# Patient Record
Sex: Female | Born: 1990 | Race: White | Hispanic: No | Marital: Single | Smoking: Never smoker
Health system: Southern US, Community
[De-identification: ages and names within clinical notes are randomized; demographics above are authoritative.]

---
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
## 2012-02-09 ENCOUNTER — Ambulatory Visit (INDEPENDENT_AMBULATORY_CARE_PROVIDER_SITE_OTHER): Payer: 59 | Admitting: Family Medicine

## 2012-02-09 VITALS — BP 92/64 | Ht 65.0 in | Wt 135.0 lb

## 2012-02-09 DIAGNOSIS — M25561 Pain in right knee: Secondary | ICD-10-CM

## 2012-02-09 DIAGNOSIS — M25569 Pain in unspecified knee: Secondary | ICD-10-CM

## 2012-02-09 MED ORDER — NITROGLYCERIN 0.2 MG/HR TD PT24
MEDICATED_PATCH | TRANSDERMAL | Status: DC
Start: 2012-02-09 — End: 2012-11-29

## 2012-02-09 MED ORDER — MELOXICAM 15 MG PO TABS: 15.0000 mg | ORAL_TABLET | Freq: Every day | ORAL | Status: DC

## 2012-02-09 NOTE — Patient Instructions (Addendum)
Very nice to meet you. I think you have a bursitis that is giving you your pain.  I am givign you a sleeve to try because I think it will keep the muscle warmer.  I am sending in a medicine called meloxicam.  Take 1 pill daily for 2 weeks then as needed therafter.   I am also giving you a nitroglycerin patch to try 1/4 patch to area daily.  I t will give you a little headache but should resolve in 4-5 days.  Do the stretches after exercise Come back in 4 weeks.  If not better then we will do a MRI.

## 2012-02-10 NOTE — Assessment & Plan Note (Signed)
Differential still broad.  ? Superior lateral displacement of the pes anserine which could be contributing to the pain, mild pain over the medial joint line but negative mcmurry test and ultrasound for mensical injury but could still be.  Patient given stretches and exercises to try to focus on hamstrings, given meloxicam to decrease inflammation and given compression sleeve to give some mild support and help with any swelling.  Will try for four weeks, if not better will consider injection and potential MRI.

## 2012-02-11 NOTE — Progress Notes (Signed)
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  Patient states she has pain mostly with certain movements such as running and going up stairs. Patient states it started to hurt last season when she went to kick a ball and then was blocked which caused pain and swelling.  Patient though felt like she got better but since playing this time it seemed to be bothering her more at this time. Patient states it make her feel like it has a a deep tight feeling but denies any locking or catching.  It does not wake her up at night.  Patient states the only thing that makes it feel better is rest but she would like to continue playing soccer.  Patient has had an MRI which was normal and done PT without any improvement. Past medical history, social, surgical and family history all reviewed.  Denies fever, chills, nausea vomiting abdominal pain, dysuria, chest pain, shortness of breath dyspnea on exertion or numbness in extremities   R knee: No gross deformity, ecchymoses, swelling. Mild medial joint line TTP and inferior inferior to the medial joint line which feel like a medial displaced pes.   No lateral joint line, post patellar facet or other TTP. FROM. Negative ant/post drawers. Negative valgus/varus testing. Negative lachmanns. Mild medial pain with mcmurrays, apleys, and thessalys.  Negative sit home.  Negative patellar apprehension, clarkes. NV intact distally. No pain with sartorius testing or knee extension, 5/5 strength. Leg lengths equal.  L knee: FROM without pain or instability.   Ultrasound performed and interpreted by me today. Patient medial meniscus was seen and appear normal with no true tear identified but had mild hypoechoic changes.  But inferior to the joint lint patient does have fluid filled sac that appears to be a bursae and insertion of hamstrings that appear to have mild separation and neovascularization.

## 2012-02-12 ENCOUNTER — Other Ambulatory Visit: Payer: Self-pay | Admitting: *Deleted

## 2012-02-12 MED ORDER — NAPROXEN 500 MG PO TABS: 500.0000 mg | ORAL_TABLET | Freq: Two times a day (BID) | ORAL | Status: DC | PRN

## 2012-03-12 ENCOUNTER — Encounter: Payer: Self-pay | Admitting: Family Medicine

## 2012-03-12 ENCOUNTER — Ambulatory Visit (INDEPENDENT_AMBULATORY_CARE_PROVIDER_SITE_OTHER): Payer: 59 | Admitting: Family Medicine

## 2012-03-12 VITALS — BP 117/66 | HR 92

## 2012-03-12 DIAGNOSIS — M25561 Pain in right knee: Secondary | ICD-10-CM

## 2012-03-12 DIAGNOSIS — M25569 Pain in unspecified knee: Secondary | ICD-10-CM

## 2012-03-12 NOTE — Progress Notes (Signed)
Patient ID: Natalie Powell, female   DOB: 1991/03/27, 21 y.o.   MRN: [id number]  SUBJECTIVE: Natalie Powell is a 21 y.o. female with unremarkable PMHx presenting for follow-up of RIGHT knee pain and new RIGHT hip pain.  Pt with knee pain of unclear etiology at last visit 3 weeks ago, considered pes anserine vs hamstring strain vs other etiologies.  Provided with meloxicam to decrease inflammation, compression sleeve and stretches / strengthening for hamstrings.  Had difficulty tolerating meloxicam secondary to GI upset but switched to alleve and tolerated that well for a scheduled, 10-day course.  Pt reports resolution of knee pain, now pain-free with activity with respect to knee pain.  She runs daily and has not pain in knees, able to complete all ADL without pain as well.  Her primary concern for today is RIGHT hip pain which she reports is nagging, dull during running.  Pain-free with walking.  Pt denies any specific injury, trauma or specific moment of onset with regard to hip pain.  She has not tried any medications or other interventions for the pain.  Denies any instability, popping, catching or clicking with respect to her RIGHT hip.       OBJECTIVE: Vital signs as noted above;  Well appearing young female, resting comfortably and in NAD Bilateral knee exam, hip exam: - On inspection there are no apparent abnormalities or deficits - AROM of knee symmetric and appropriate bilaterally in flexion, extension - AROM of hip symmetric in hip flexion, extension, IR, ER, abduction and adduction. Though symmetric there is deficit in active and passive ER of hip, also in extension.  Deficits improve slightly but still present with PROM. - 5/5 strength in each of the above movements; all resisted movements are pain-free - 4+ or 5/5 strength in bilateral hip abduction  - 5/5 strength in supine hamstring flexion and 90 and 30 degrees of knee flexion, pain-free - FABERs bilaterally is significantly limited  with knee laying approx 6 cm above the un-flexed thigh.  Unable to push knee down to table (or even level of un-flexed thigh) secondary to poor flexibility - Negative ober's test bilaterally - Bilaterally, negative ant/post drawers, lachman's, valgus/varus on knees - Negative for tenderness along joint line and major bony landmarks of bilateral knees  ASSESSMENT: 1. RIGHT Hip pain secondary to tight iliotibial band and hamstrings (right > left)  PLAN: 21 y/o female with resolved RIGHT knee pain with exercises and stretches.  Now her primary issue is RIGHT hip pain, occasional (different) posterior knee pain as well.  Exam suggestive of RIGHT iliotibial band and hamstring inflexibility leading to significant restrictions in ER and extension causing pain, discomfort when running.  Pt given home exercises (after they were demonstrated) for IT band, hamstrings as well as hip abductor/adductor/IR/ER strengthening and stretching.  She will complete exercises daily, ideally in the morning, and use NSAIDs as needed for pain control.  She may continue running.  She will follow-up in 6 weeks to assess improved flexibility, strength and overall response to exercises/rehab.  Also encouraged core strengthening.

## 2012-11-21 ENCOUNTER — Other Ambulatory Visit: Payer: Self-pay | Admitting: Family Medicine

## 2012-11-21 DIAGNOSIS — R1013 Epigastric pain: Secondary | ICD-10-CM

## 2012-11-21 DIAGNOSIS — R142 Eructation: Secondary | ICD-10-CM

## 2012-11-27 ENCOUNTER — Ambulatory Visit
Admission: RE | Admit: 2012-11-27 | Discharge: 2012-11-27 | Disposition: A | Discharge status: Home / Self Care | Payer: 59 | Source: Ambulatory Visit | Attending: Family Medicine | Admitting: Family Medicine

## 2012-11-27 DIAGNOSIS — R142 Eructation: Secondary | ICD-10-CM

## 2012-11-27 DIAGNOSIS — R1013 Epigastric pain: Secondary | ICD-10-CM

## 2012-11-29 ENCOUNTER — Ambulatory Visit (INDEPENDENT_AMBULATORY_CARE_PROVIDER_SITE_OTHER): Payer: 59 | Admitting: Internal Medicine

## 2012-11-29 ENCOUNTER — Encounter: Payer: Self-pay | Admitting: Internal Medicine

## 2012-11-29 VITALS — BP 122/64 | HR 77 | Ht 65.0 in | Wt 138.4 lb

## 2012-11-29 DIAGNOSIS — R143 Flatulence: Secondary | ICD-10-CM

## 2012-11-29 DIAGNOSIS — J309 Allergic rhinitis, unspecified: Secondary | ICD-10-CM

## 2012-11-29 DIAGNOSIS — R142 Eructation: Secondary | ICD-10-CM

## 2012-11-29 NOTE — Progress Notes (Signed)
LOV 06/16/10- For Asthma/ EIA, allergic rinitis 11/29/12- 21 yoF never smoker FOLLOWS FOR: Pt c/o belching. Pt is having a hard time taking deep breath. She does notice chest pains when she belches. Denies any wheezing. no chest tx, no cough. does not feel like it is reflux, it is all "air".   Mother is here Says she can't breathe through her nose normally. Admits her nose is not stopped up. She does not have to blow it.  Complains of belching as described above, since July.  ROS-see HPI Constitutional:   No-   weight loss, night sweats, fevers, chills, fatigue, lassitude. HEENT:   No-  headaches, difficulty swallowing, tooth/dental problems, sore throat,       No-  sneezing, itching, ear ache, nasal congestion, post nasal drip,  CV:  No-   chest pain, orthopnea, PND, swelling in lower extremities, anasarca,  dizziness, palpitations Resp: No-   shortness of breath with exertion or at rest.              No-   productive cough,  No non-productive cough,  No- coughing up of blood.              No-   change in color of mucus.  No- wheezing.   Skin: No-   rash or lesions. GI:  No-   heartburn, indigestion, abdominal pain, nausea, vomiting,  GU: . MS:  No-   joint pain or swelling.  . Neuro-     nothing unusual Psych:  No- change in mood or affect. No depression or anxiety.  No memory loss.  OBJ- Physical Exam General- Alert, Oriented, Affect-appropriate, Distress- none acute Skin- rash-none, lesions- none, excoriation- none Lymphadenopathy- none Head- atraumatic            Eyes- Gross vision intact, PERRLA, conjunctivae and secretions clear            Ears- Hearing, canals-normal            Nose- Clear, narrow but not obstructed, no-Septal dev, mucus, polyps, erosion, perforation             Throat- Mallampati II , mucosa clear , drainage- none, tonsils- atrophic Neck- flexible , trachea midline, no stridor , thyroid nl, carotid no bruit Chest - symmetrical excursion , unlabored  Heart/CV- RRR , no murmur , no gallop  , no rub, nl s1 s2                           - JVD- none , edema- none, stasis changes- none, varices- none           Lung- clear to P&A, wheeze- none, cough- none , dullness-none, rub- none           Chest wall-  Abd-  Br/ Gen/ Rectal- Not done, not indicated Extrem- cyanosis- none, clubbing, none, atrophy- none, strength- nl Neuro- grossly intact to observation

## 2012-11-29 NOTE — Patient Instructions (Addendum)
Sample Nasonex or Omnaris     1-2 puffs each nostril once daily at bedtime  Use it up and see if there is any difference in the nose breathing  You can also try Breathe Right Nasal strips to see how that feels  For the belching, be aware of air-swallowing, avoid foods that make you feel gassey, and avoid baking soda/ sodium bicarbonate and carbonated drinks.  If you continue to feel breathing through your nose isn't easy, then I would suggest you see an ENT who has tools to look back farther.

## 2012-12-08 ENCOUNTER — Encounter: Payer: Self-pay | Admitting: Internal Medicine

## 2012-12-08 DIAGNOSIS — R142 Eructation: Secondary | ICD-10-CM | POA: Insufficient documentation

## 2012-12-08 NOTE — Assessment & Plan Note (Signed)
Plan-try Flonase and nasal strips

## 2012-12-08 NOTE — Assessment & Plan Note (Signed)
She is swallowing a lot of air. Nasal airway is not obviously blocked. We discussed this as a habit pattern, probably involving some anxiety. Plan-make appointment breathing through the nose. Nasal strips may make that more comfortable. Can also try Flonase if that changes what nose breathing feels like to her. Simethicone.

## 2013-01-20 ENCOUNTER — Other Ambulatory Visit: Payer: Self-pay | Admitting: Gastroenterology

## 2013-01-20 ENCOUNTER — Other Ambulatory Visit (HOSPITAL_COMMUNITY): Payer: Self-pay | Admitting: Gastroenterology

## 2013-01-20 DIAGNOSIS — R110 Nausea: Secondary | ICD-10-CM

## 2013-01-24 ENCOUNTER — Ambulatory Visit (HOSPITAL_COMMUNITY)
Admission: RE | Admit: 2013-01-24 | Discharge: 2013-01-24 | Disposition: A | Discharge status: Home / Self Care | Payer: 59 | Source: Ambulatory Visit | Attending: Gastroenterology | Admitting: Gastroenterology

## 2013-01-24 DIAGNOSIS — R6881 Early satiety: Secondary | ICD-10-CM | POA: Insufficient documentation

## 2013-01-24 DIAGNOSIS — R110 Nausea: Secondary | ICD-10-CM

## 2013-01-24 DIAGNOSIS — R141 Gas pain: Secondary | ICD-10-CM | POA: Insufficient documentation

## 2013-01-24 DIAGNOSIS — R142 Eructation: Secondary | ICD-10-CM | POA: Insufficient documentation

## 2013-02-07 ENCOUNTER — Ambulatory Visit (HOSPITAL_COMMUNITY)
Admission: RE | Admit: 2013-02-07 | Discharge: 2013-02-07 | Disposition: A | Discharge status: Home / Self Care | Payer: 59 | Source: Ambulatory Visit | Attending: Gastroenterology | Admitting: Gastroenterology

## 2013-02-07 DIAGNOSIS — R110 Nausea: Secondary | ICD-10-CM | POA: Insufficient documentation

## 2013-02-07 DIAGNOSIS — R142 Eructation: Secondary | ICD-10-CM | POA: Insufficient documentation

## 2013-02-07 DIAGNOSIS — R141 Gas pain: Secondary | ICD-10-CM | POA: Insufficient documentation

## 2013-02-07 MED ORDER — TECHNETIUM TC 99M MEBROFENIN IV KIT
5.2000 | PACK | Freq: Once | INTRAVENOUS | Status: AC | PRN
  Administered 2013-02-07: 5 via INTRAVENOUS

## 2013-02-17 ENCOUNTER — Other Ambulatory Visit (HOSPITAL_COMMUNITY): Payer: Self-pay | Admitting: Gastroenterology

## 2013-02-17 DIAGNOSIS — R110 Nausea: Secondary | ICD-10-CM

## 2013-02-25 ENCOUNTER — Encounter (HOSPITAL_COMMUNITY)
Admission: RE | Admit: 2013-02-25 | Discharge: 2013-02-25 | Disposition: A | Discharge status: Home / Self Care | Payer: 59 | Source: Ambulatory Visit | Attending: Gastroenterology | Admitting: Gastroenterology

## 2013-02-25 DIAGNOSIS — R110 Nausea: Secondary | ICD-10-CM

## 2013-02-25 MED ORDER — TECHNETIUM TC 99M SULFUR COLLOID
2.0000 | Freq: Once | INTRAVENOUS | Status: AC | PRN
  Administered 2013-02-25: 2 via ORAL

## 2013-07-31 ENCOUNTER — Other Ambulatory Visit: Payer: Self-pay | Admitting: Sports Medicine

## 2013-07-31 ENCOUNTER — Ambulatory Visit
Admission: RE | Admit: 2013-07-31 | Discharge: 2013-07-31 | Disposition: A | Discharge status: Home / Self Care | Payer: Managed Care, Other (non HMO) | Source: Ambulatory Visit | Attending: Sports Medicine | Admitting: Sports Medicine

## 2013-07-31 ENCOUNTER — Encounter: Payer: Self-pay | Admitting: Sports Medicine

## 2013-07-31 ENCOUNTER — Ambulatory Visit (INDEPENDENT_AMBULATORY_CARE_PROVIDER_SITE_OTHER): Payer: Managed Care, Other (non HMO) | Admitting: Sports Medicine

## 2013-07-31 VITALS — BP 110/74 | Ht 65.0 in | Wt 135.0 lb

## 2013-07-31 DIAGNOSIS — M545 Low back pain, unspecified: Secondary | ICD-10-CM

## 2013-08-01 NOTE — Progress Notes (Signed)
   Subjective:    Patient ID: Natalie Powell, female    DOB: 1990-08-03, 23 y.o.   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 No trauma that she can recall but gradual onset of pain that is present mainly with walking and running. She will get some occasional spasm in the morning as well. Pain is aching in quality and she localizes it around the right SI joint. Denies any left-sided low back pain. Denies any groin pain. Denies radiating pain into her legs. No numbness or tingling. She has tried some simple stretches but has not tried any pain medication. She has not had low back problems in the past. No fevers or chills. No weight loss. She finds that forward flexion or sitting in a slumped position helps her pain.  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 No acute distress. Awake alert and oriented x3. Vital signs reviewed.  Lumbar spine: Full lumbar range of motion. Pain with extension. Pain with stork testing bilaterally. No tenderness to palpation along the lumbar midline nor along the paraspinal musculature. No spasm. No tenderness over the SI joint. Negative Faber's. Negative log roll. Strength is 5/5 both lower extremities. Reflexes trace but equal at the Achilles and patellar tendons.  Patient has fairly well-preserved arches in her feet. Evaluation of her running form shows her to hold the right arm and elbow right up against the side of her body. She demonstrates no real follow through with the right arm. When I asked her if she was trying to split her side due to pain she denied any pain currently.  X-rays of her lumbar spine including AP, lateral, and oblique views are unremarkable. Specifically I see no evidence of pars defect or stress  fracture.       Assessment & Plan:  1. Right-sided low back pain likely mechanical in nature due to poor running form  I've instructed the patient in some simple line drills with the emphasis being on making sure that her right hand comes up to the midline during the swing phase of running. She will perform these drills twice a week and if her symptoms persist we discussed the possibility of formal physical therapy versus an MRI of her lumbar spine to rule out an occult lumbar stress fracture. Since she is in school at [employer], we will leave followup open ended but she is instructed to notify me if her symptoms do not resolve over the next 3-4 weeks.

## 2014-08-15 IMAGING — US US ABDOMEN COMPLETE
1 series · 14 of 25 positions shown · non-contrast
Comparison: None.

CLINICAL DATA: Epigastric pain, belching

COMPLETE ABDOMINAL ULTRASOUND

[Series 1: us abdomen complete · 0.33mm/px · 14 of 73 slices shown]
[im 1/73]
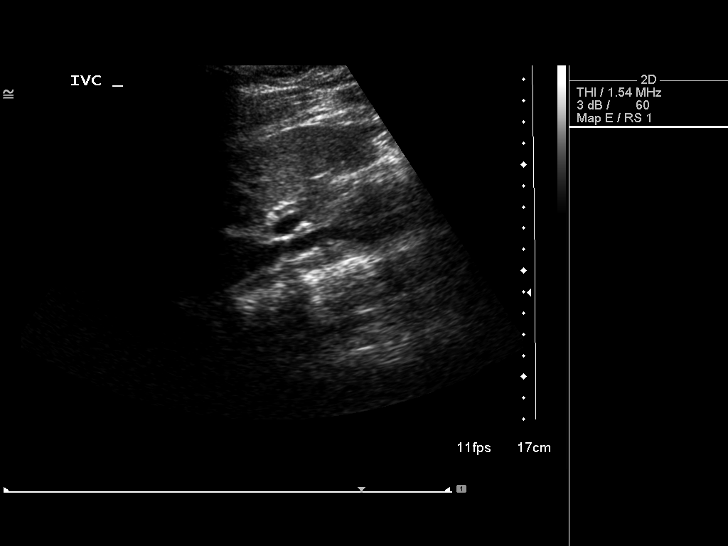
[im 7/73]
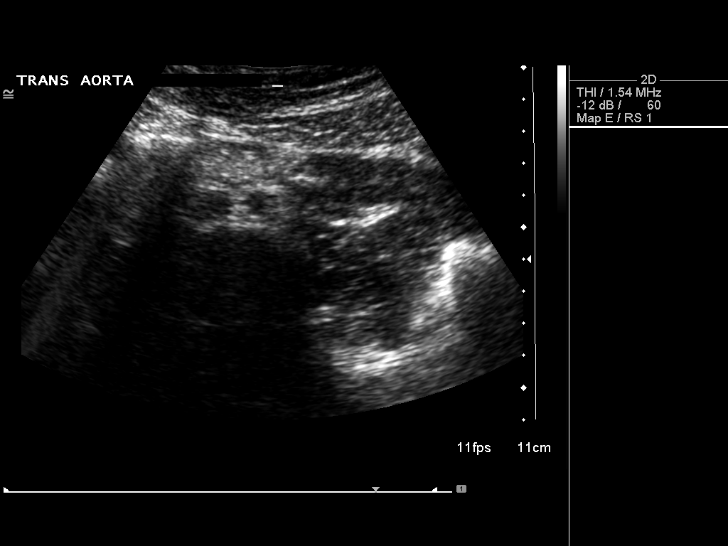
[im 13/73]
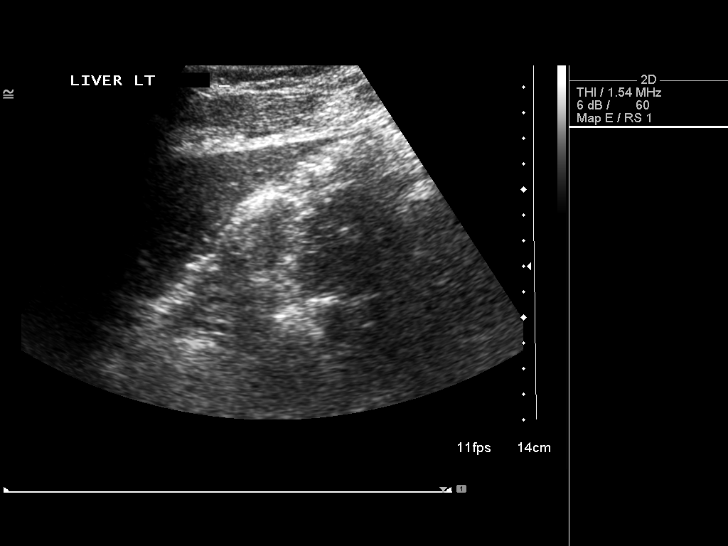
[im 19/73]
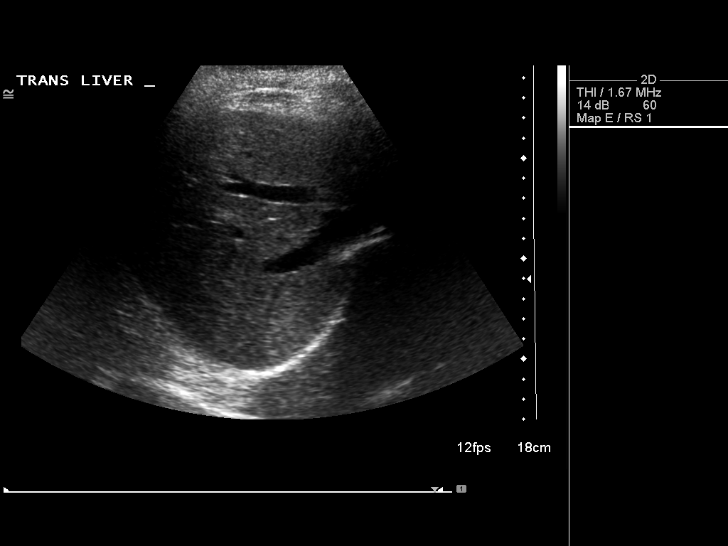
[im 25/73]
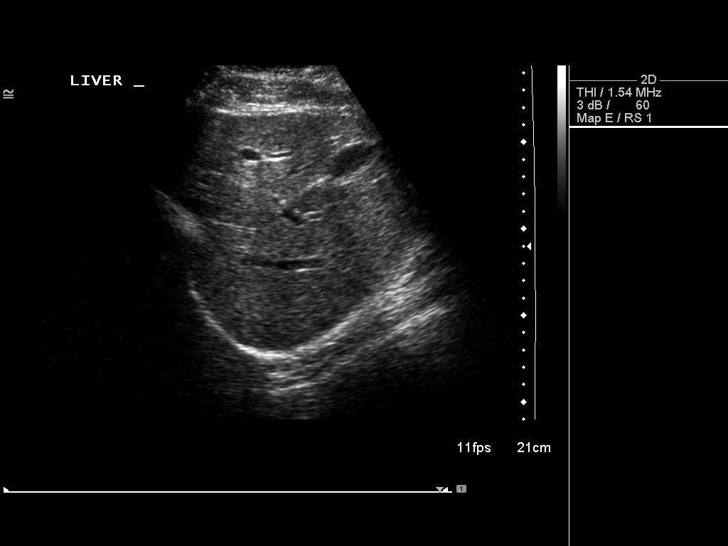
[im 28/73]
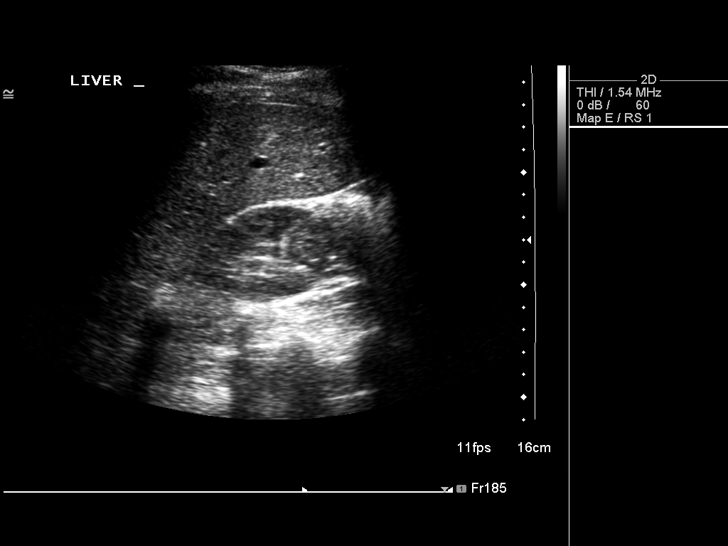
[im 34/73]
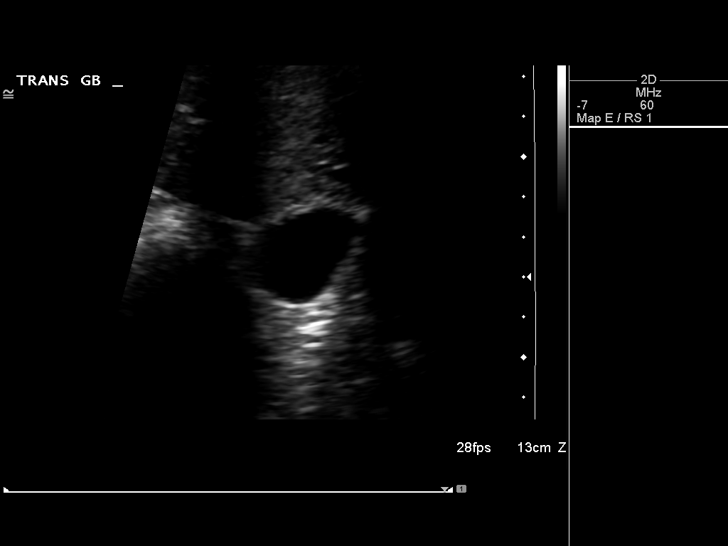
[im 40/73]
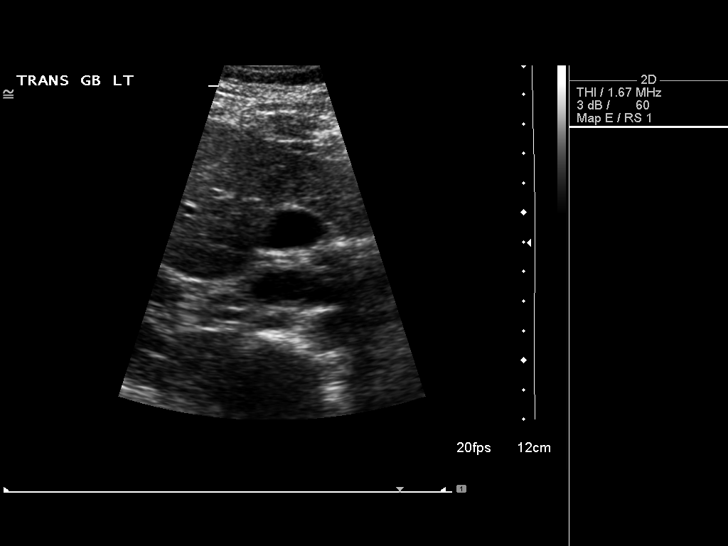
[im 46/73]
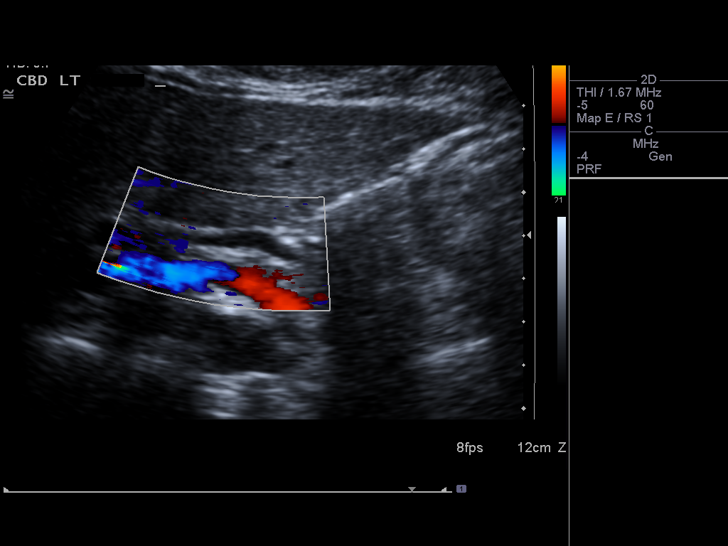
[im 49/73]
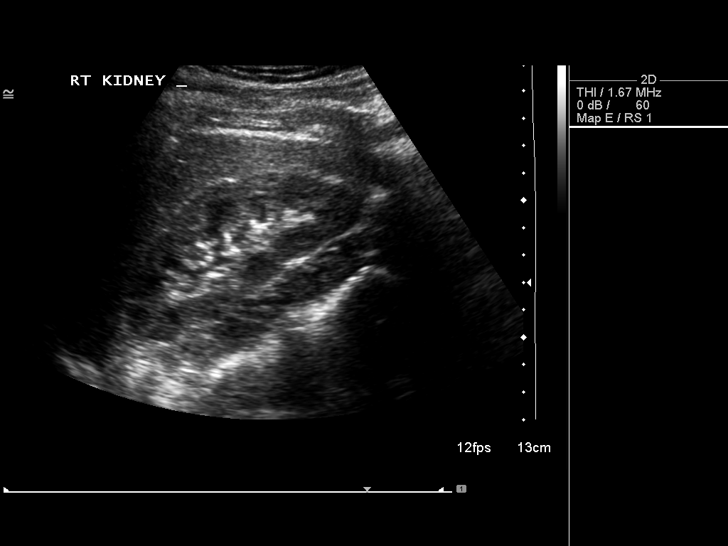
[im 55/73]
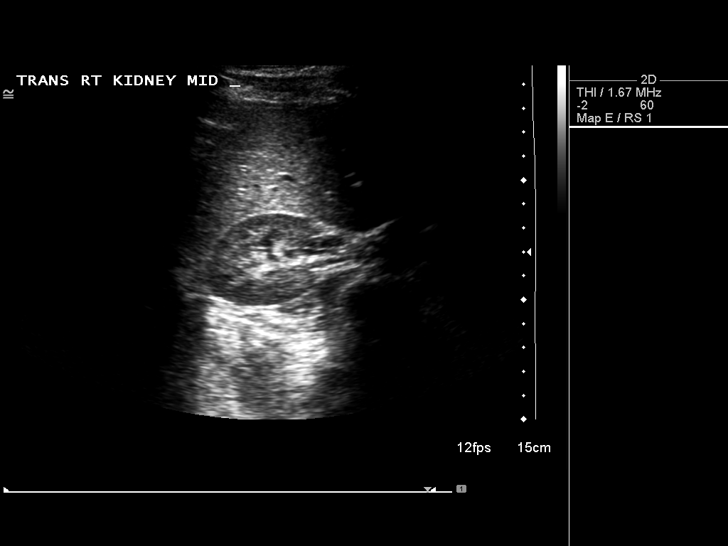
[im 61/73]
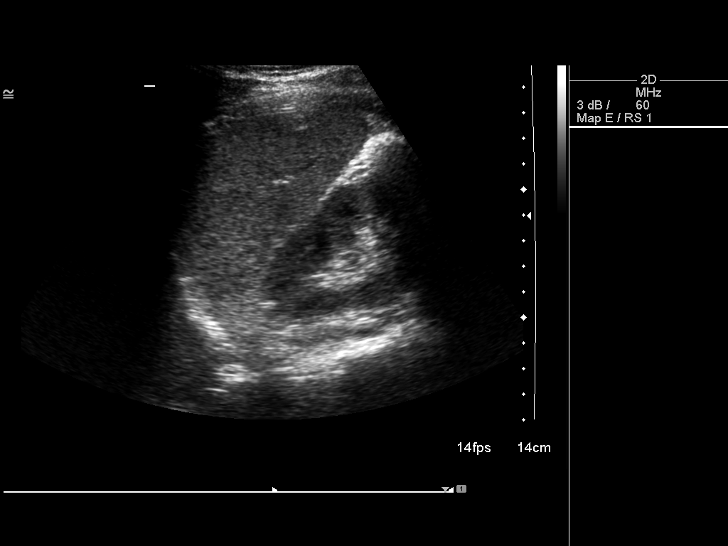
[im 67/73]
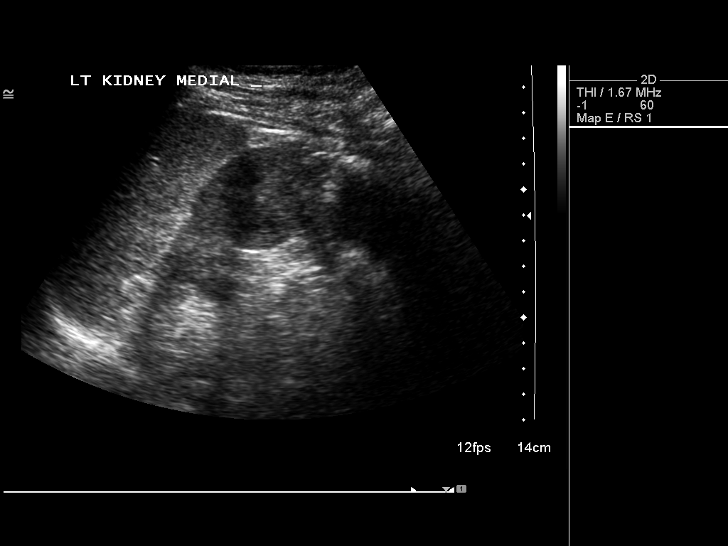
[im 73/73]
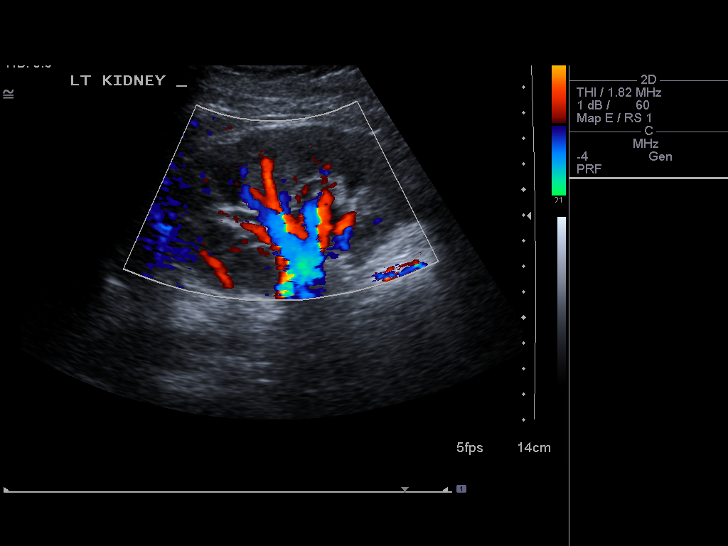

[14 of 25 positions shown; findings below may reference images not displayed]

FINDINGS: Gallbladder:  The gallbladder is visualized and no gallstones are
noted.  There is no pain over the gallbladder with compression.

Common bile duct:  The common bile duct is normal measuring 4.5 mm
in diameter.

Liver:  The liver has a normal echogenic pattern.  No focal
abnormality is seen.

IVC:  Appears normal.

Pancreas:  The head and tail of the pancreas are obscured by bowel
gas.  The mid body is unremarkable.

Spleen:  The spleen is normal measuring 10.2 cm sagittally.

Right Kidney:  No hydronephrosis is seen.  The right kidney
measures 9.7 cm sagittally.

Left Kidney:  No hydronephrosis is noted.  The left kidney measures
10.7 cm.

Abdominal aorta:  The abdominal aorta is normal caliber.
IMPRESSION: 1.  No gallstones.  No ductal dilatation.
2.  The head and tail of the pancreas are obscured by bowel gas.

## 2014-12-08 ENCOUNTER — Encounter: Payer: Self-pay | Admitting: Endocrinology

## 2014-12-08 ENCOUNTER — Ambulatory Visit (INDEPENDENT_AMBULATORY_CARE_PROVIDER_SITE_OTHER): Payer: 59 | Admitting: Endocrinology

## 2014-12-08 ENCOUNTER — Telehealth: Payer: Self-pay | Admitting: Endocrinology

## 2014-12-08 VITALS — BP 112/68 | HR 62 | Temp 97.6°F | Ht 65.0 in | Wt 133.0 lb

## 2014-12-08 DIAGNOSIS — E039 Hypothyroidism, unspecified: Secondary | ICD-10-CM

## 2014-12-08 MED ORDER — LEVOTHYROXINE SODIUM 75 MCG PO TABS: 75.0000 ug | ORAL_TABLET | Freq: Every day | ORAL | Status: DC

## 2014-12-08 NOTE — Telephone Encounter (Signed)
Pt mom is wanting to know why labs where not done today but Dr. Everardo All did RX her a new med. I informed the mom that we were unable to discuss anything with her at this time without a DPR on file but that if the pt has concerns about her care then she can give [place] a call

## 2014-12-08 NOTE — Patient Instructions (Addendum)
i have sent a prescription to your pharmacy, for thyroid hormone. Please redo the blood test in approx 1 month.  i'll let you know about the results. I would be happy to see you back here as necessary.       Levothyroxine oral capsules What is this medicine? LEVOTHYROXINE (lee voe thye ROX een) is a thyroid hormone. This medicine can improve symptoms of thyroid deficiency such as slow speech, lack of energy, weight gain, hair loss, dry skin, and feeling cold. It also helps to treat goiter (an enlarged thyroid gland). It is also used to treat some kinds of thyroid cancer along with surgery and other medicines. This medicine may be used for other purposes; ask your health care provider or pharmacist if you have questions. COMMON BRAND NAME(S): Tirosint What should I tell my health care provider before I take this medicine? They need to know if you have any of these conditions: -angina -blood clotting problems -diabetes -dieting or on a weight loss program -fertility problems -heart disease -high levels of thyroid hormone -pituitary gland problem -previous heart attack -an unusual or allergic reaction to levothyroxine, thyroid hormones, other medicines, foods, dyes, or preservatives -pregnant or trying to get pregnant -breast-feeding How should I use this medicine? Take this medicine by mouth with a glass of water. It is best to take on an empty stomach, at least 30 minutes to one hour before breakfast. Avoid taking antacids containing aluminum or magnesium, simethicone, bile acid sequestrants, calcium carbonate, sodium polystyrene sulfonate, ferrous sulfate, and sucralfate within 4 hours of taking this medicine. Do not cut, crush or chew this medicine. Follow the directions on the prescription label. Take at the same time each day. Do not take your medicine more often than directed. Talk to your pediatrician regarding the use of this medicine in children. While this drug may be prescribed  for selected conditions, precautions do apply. Since the capsules cannot be crushed or placed in water, they may only be given to infants and children who are able to swallow an intact capsule. Overdosage: If you think you've taken too much of this medicine contact a poison control center or emergency room at once. Overdosage: If you think you have taken too much of this medicine contact a poison control center or emergency room at once. NOTE: This medicine is only for you. Do not share this medicine with others. What if I miss a dose? If you miss a dose, take it as soon as you can. If it is almost time for your next dose, take only that dose. Do not take double or extra doses. What may interact with this medicine? -amiodarone -antacids -anti-thyroid medicines -calcium supplements -carbamazepine -cholestyramine -colestipol -digoxin -female hormones, including contraceptive or birth control pills -iron supplements -ketamine -liquid nutrition products like Ensure -medicines for colds and breathing difficulties -medicines for diabetes -medicines for mental depression -medicines or herbals used to decrease weight or appetite -phenobarbital or other barbiturate medications -phenytoin -prednisone or other corticosteroids -rifabutin -rifampin -simethicone -sodium polystyrene sulfonate -soy isoflavones -sucralfate -theophylline -warfarin This list may not describe all possible interactions. Give your health care provider a list of all the medicines, herbs, non-prescription drugs, or dietary supplements you use. Also tell them if you smoke, drink alcohol, or use illegal drugs. Some items may interact with your medicine. What should I watch for while using this medicine? Do not switch brands of this medicine unless your health care professional agrees with the change. Ask questions if you are uncertain.  You will need regular exams and occasional blood tests to check the response to  treatment. If you are receiving this medicine for an underactive thyroid, it may be several weeks before you notice an improvement. Check with your doctor or health care professional if your symptoms do not improve. It may be necessary for you to take this medicine for the rest of your life. Do not stop using this medicine unless your doctor or health care professional advises you to. This medicine can affect blood sugar levels. If you have diabetes, check your blood sugar as directed. You may lose some of your hair when you first start treatment. With time, this usually corrects itself. If you are going to have surgery, tell your doctor or health care professional that you are taking this medicine. What side effects may I notice from receiving this medicine? Side effects that you should report to your doctor or health care professional as soon as possible: -allergic reactions like skin rash, itching or hives, swelling of the face, lips, or tongue -chest pain -excessive sweating or intolerance to heat -fast or irregular heartbeat -nervousness -swelling of ankles, feet, or legs -tremors Side effects that usually do not require medical attention (Report these to your doctor or health care professional if they continue or are bothersome.): -changes in appetite -changes in menstrual periods -diarrhea -hair loss -headache -trouble sleeping -weight loss This list may not describe all possible side effects. Call your doctor for medical advice about side effects. You may report side effects to FDA at 1-800-FDA-1088. Where should I keep my medicine? Keep out of the reach of children. Store at room temperature between 15 and 30 degrees C (59 and 86 degrees F). Protect from light and moisture. Keep container tightly closed. Throw away any unused medicine after the expiration date. NOTE: This sheet is a summary. It may not cover all possible information. If you have questions about this medicine, talk  to your doctor, pharmacist, or health care provider.  2015, Elsevier/Gold Standard. (2008-07-09 15:25:58)

## 2014-12-08 NOTE — Progress Notes (Signed)
Subjective:    Patient ID: Natalie Powell, female    DOB: 04/28/1991, 24 y.o.   MRN: [id number]  HPI Pt reports hypothyroidism was dx'ed in early 2015.  she has been on only nature-thyroid since then.  she has never taken kelp or any other type of non-prescribed thyroid product.  she has never had thyroid imaging.  She is not considering a pregnancy.  she has never had thyroid surgery, or XRT to the neck.  He has never been on amiodarone or lithium.  pt states she feels well in general.   Past Medical History  Diagnosis Date  . ALLERGIC RHINITIS   . Asthma     Exercised Induced    No past surgical history on file.  Social History   Social History  . Marital Status: Single    Spouse Name: N/A  . Number of Children: N/A  . Years of Education: N/A   Occupational History  . Student    Social History Main Topics  . Smoking status: Never Smoker   . Smokeless tobacco: Not on file  . Alcohol Use: 0.0 oz/week    0 Standard drinks or equivalent per week  . Drug Use: Not on file  . Sexual Activity: Not on file   Other Topics Concern  . Not on file   Social History Narrative   Lives with parents-Mother is an Occupational hygienist for the [employer]    No current outpatient prescriptions on file prior to visit.   No current facility-administered medications on file prior to visit.    No Known Allergies  Family History  Problem Relation Age of Onset  . Allergies Mother   . Asthma Mother   . Sudden death Neg Hx   . Hypertension Neg Hx   . Hyperlipidemia Neg Hx   . Heart attack Neg Hx   . Diabetes Neg Hx   . Thyroid disease Mother     BP 112/68 mmHg  Pulse 62  Temp(Src) 97.6 F (36.4 C) (Oral)  Ht  (1.651 m)  Wt 133 lb (60.328 kg)  BMI 22.13 kg/m2  SpO2 97%  LMP 10/10/2014  Review of Systems denies depression, hair loss, muscle cramps, sob, weight gain, constipation, numbness, blurry vision, cold intolerance, myalgias, dry skin, rhinorrhea, easy bruising, and  syncope.      Objective:   Physical Exam VS: see vs page GEN: no distress HEAD: head: no deformity eyes: no periorbital swelling, no proptosis external nose and ears are normal mouth: no lesion seen NECK: supple, thyroid is not enlarged CHEST WALL: no deformity LUNGS:  Clear to auscultation CV: reg rate and rhythm, no murmur MUSCULOSKELETAL: muscle bulk and strength are grossly normal.  no obvious joint swelling.  gait is normal and steady EXTEMITIES: no deformity.  no ulcer on the feet.  feet are of normal color and temp.  no edema PULSES: dorsalis pedis intact bilat.  no carotid bruit NEURO:  cn 2-12 grossly intact.   readily moves all 4's.  sensation is intact to touch on the feet SKIN:  Normal texture and temperature.  No rash or suspicious lesion is visible.   NODES:  None palpable at the neck PSYCH: alert, well-oriented.  Does not appear anxious nor depressed.    outside test results are reviewed: TSH (2015)=0.85  i have reviewed outside records (office visit): Pt was seen, and was noted to be feeling well.      Assessment & Plan:  Hypothyroidism: we discussed changing to  levothyroxine, and pt agrees.  Patient is advised the following: Patient Instructions  i have sent a prescription to your pharmacy, for thyroid hormone. Please redo the blood test in approx 1 month.  i'll let you know about the results. I would be happy to see you back here as necessary.       Levothyroxine oral capsules What is this medicine? LEVOTHYROXINE (lee voe thye ROX een) is a thyroid hormone. This medicine can improve symptoms of thyroid deficiency such as slow speech, lack of energy, weight gain, hair loss, dry skin, and feeling cold. It also helps to treat goiter (an enlarged thyroid gland). It is also used to treat some kinds of thyroid cancer along with surgery and other medicines. This medicine may be used for other purposes; ask your health care provider or pharmacist if you have  questions. COMMON BRAND NAME(S): Tirosint What should I tell my health care provider before I take this medicine? They need to know if you have any of these conditions: -angina -blood clotting problems -diabetes -dieting or on a weight loss program -fertility problems -heart disease -high levels of thyroid hormone -pituitary gland problem -previous heart attack -an unusual or allergic reaction to levothyroxine, thyroid hormones, other medicines, foods, dyes, or preservatives -pregnant or trying to get pregnant -breast-feeding How should I use this medicine? Take this medicine by mouth with a glass of water. It is best to take on an empty stomach, at least 30 minutes to one hour before breakfast. Avoid taking antacids containing aluminum or magnesium, simethicone, bile acid sequestrants, calcium carbonate, sodium polystyrene sulfonate, ferrous sulfate, and sucralfate within 4 hours of taking this medicine. Do not cut, crush or chew this medicine. Follow the directions on the prescription label. Take at the same time each day. Do not take your medicine more often than directed. Talk to your pediatrician regarding the use of this medicine in children. While this drug may be prescribed for selected conditions, precautions do apply. Since the capsules cannot be crushed or placed in water, they may only be given to infants and children who are able to swallow an intact capsule. Overdosage: If you think you've taken too much of this medicine contact a poison control center or emergency room at once. Overdosage: If you think you have taken too much of this medicine contact a poison control center or emergency room at once. NOTE: This medicine is only for you. Do not share this medicine with others. What if I miss a dose? If you miss a dose, take it as soon as you can. If it is almost time for your next dose, take only that dose. Do not take double or extra doses. What may interact with this  medicine? -amiodarone -antacids -anti-thyroid medicines -calcium supplements -carbamazepine -cholestyramine -colestipol -digoxin -female hormones, including contraceptive or birth control pills -iron supplements -ketamine -liquid nutrition products like Ensure -medicines for colds and breathing difficulties -medicines for diabetes -medicines for mental depression -medicines or herbals used to decrease weight or appetite -phenobarbital or other barbiturate medications -phenytoin -prednisone or other corticosteroids -rifabutin -rifampin -simethicone -sodium polystyrene sulfonate -soy isoflavones -sucralfate -theophylline -warfarin This list may not describe all possible interactions. Give your health care provider a list of all the medicines, herbs, non-prescription drugs, or dietary supplements you use. Also tell them if you smoke, drink alcohol, or use illegal drugs. Some items may interact with your medicine. What should I watch for while using this medicine? Do not switch brands of this medicine unless  your health care professional agrees with the change. Ask questions if you are uncertain. You will need regular exams and occasional blood tests to check the response to treatment. If you are receiving this medicine for an underactive thyroid, it may be several weeks before you notice an improvement. Check with your doctor or health care professional if your symptoms do not improve. It may be necessary for you to take this medicine for the rest of your life. Do not stop using this medicine unless your doctor or health care professional advises you to. This medicine can affect blood sugar levels. If you have diabetes, check your blood sugar as directed. You may lose some of your hair when you first start treatment. With time, this usually corrects itself. If you are going to have surgery, tell your doctor or health care professional that you are taking this medicine. What side  effects may I notice from receiving this medicine? Side effects that you should report to your doctor or health care professional as soon as possible: -allergic reactions like skin rash, itching or hives, swelling of the face, lips, or tongue -chest pain -excessive sweating or intolerance to heat -fast or irregular heartbeat -nervousness -swelling of ankles, feet, or legs -tremors Side effects that usually do not require medical attention (Report these to your doctor or health care professional if they continue or are bothersome.): -changes in appetite -changes in menstrual periods -diarrhea -hair loss -headache -trouble sleeping -weight loss This list may not describe all possible side effects. Call your doctor for medical advice about side effects. You may report side effects to FDA at 1-800-FDA-1088. Where should I keep my medicine? Keep out of the reach of children. Store at room temperature between 15 and 30 degrees C (59 and 86 degrees F). Protect from light and moisture. Keep container tightly closed. Throw away any unused medicine after the expiration date. NOTE: This sheet is a summary. It may not cover all possible information. If you have questions about this medicine, talk to your doctor, pharmacist, or health care provider.  2015, Elsevier/Gold Standard. (2008-07-09 15:25:58)

## 2014-12-21 ENCOUNTER — Ambulatory Visit: Payer: Managed Care, Other (non HMO) | Admitting: Endocrinology

## 2015-02-04 ENCOUNTER — Ambulatory Visit (INDEPENDENT_AMBULATORY_CARE_PROVIDER_SITE_OTHER): Payer: 59 | Admitting: Internal Medicine

## 2015-02-04 ENCOUNTER — Other Ambulatory Visit: Payer: Self-pay | Admitting: *Deleted

## 2015-02-04 ENCOUNTER — Encounter: Payer: Self-pay | Admitting: Internal Medicine

## 2015-02-04 ENCOUNTER — Other Ambulatory Visit (INDEPENDENT_AMBULATORY_CARE_PROVIDER_SITE_OTHER): Payer: 59

## 2015-02-04 VITALS — BP 110/62 | HR 73 | Temp 97.4°F | Resp 12 | Ht 65.0 in | Wt 132.0 lb

## 2015-02-04 DIAGNOSIS — E063 Autoimmune thyroiditis: Secondary | ICD-10-CM

## 2015-02-04 DIAGNOSIS — E039 Hypothyroidism, unspecified: Secondary | ICD-10-CM

## 2015-02-04 DIAGNOSIS — E038 Other specified hypothyroidism: Secondary | ICD-10-CM

## 2015-02-04 LAB — TSH: TSH: 0.15 u[IU]/mL — AB (ref 0.35–4.50)

## 2015-02-04 LAB — T4, FREE: FREE T4: 1.22 ng/dL (ref 0.60–1.60)

## 2015-02-04 NOTE — Patient Instructions (Signed)
Please stop at the lab.  Continue Levothyroxine 75 mcg daily.  Take the thyroid hormone every day, with water, >30 minutes before breakfast, separated by >4 hours from acid reflux medications, calcium, iron, multivitamins.  Please come back for a follow-up appointment in 6 months.

## 2015-02-04 NOTE — Progress Notes (Signed)
Patient ID: Natalie Powell, female   DOB: 12/16/1990, 24 y.o.   MRN: [id number]   HPI  Natalie Powell is a 24 y.o.-year-old female, referred by her PCP, Dr. Zachery Dauer, for management of Hashimoto's hypothyroidism. She was seen by Dr Everardo All 2 mo ago. Would like a second opinion.  Pt. has been dx with hypothyroidism in 2015 (at that time, she had positive TPO Ab's); is on Levothyroxine 75 mcg. She was on NatureThroid 32.5 mg, changed to LT4 by Dr Everardo All in 11/2014. She feels better on the LT4. She feels she got her eyebrows back.  She is taking the thyroid hormone: - fasting - with water - separated by 20 min from b'fast  - no calcium, iron, PPIs, multivitamins   I reviewed pt's thyroid tests: 05/2014: normal TSH 09/22/2013: TSH 0.85, fT4 0.87 (0.61-1.12)  Pt denies:  - weight gain - fatigue - cold intolerance - depression - constipation - dry skin - hair loss  She has: - + irregular menses - cycles come every second month; 1 ovarian cyst - + hot flushes at night  Pt denies feeling nodules in neck, hoarseness, dysphagia/odynophagia, SOB with lying down.  She has + FH of thyroid disorders in: mother  - hypothyroidisn. No FH of thyroid cancer.  No h/o radiation tx to head or neck. No recent use of iodine supplements.  ROS: Constitutional: + see HPI Eyes: no blurry vision, no xerophthalmia ENT: no sore throat, no nodules palpated in throat, no dysphagia/odynophagia, no hoarseness Cardiovascular: no CP/SOB/palpitations/leg swelling Respiratory: no cough/SOB Gastrointestinal: no N/V/D/C Musculoskeletal: no muscle/joint aches Skin: no rashes Neurological: no tremors/numbness/tingling/dizziness Psychiatric: no depression/anxiety  Past Medical History  Diagnosis Date  . ALLERGIC RHINITIS   . Asthma     Exercised Induced   No past surgical history on file. Social History   Social History  . Marital Status: Single    Spouse Name: N/A  . Number of Children: 0    Occupational History  . Student    Social History Main Topics  . Smoking status: Never Smoker   . Smokeless tobacco: No  . Alcohol Use: 0.0 oz/week    0 Standard drinks or equivalent per week  . Drug Use: No   Social History Narrative   Lives with parents-Mother is an Occupational hygienist for the [employer]   Current Outpatient Prescriptions on File Prior to Visit  Medication Sig Dispense Refill  . levothyroxine (SYNTHROID, LEVOTHROID) 75 MCG tablet Take 1 tablet (75 mcg total) by mouth daily before breakfast. 30 tablet 11   No current facility-administered medications on file prior to visit.   No Known Allergies Family History  Problem Relation Age of Onset  . Allergies Mother   . Asthma Mother   . Sudden death Neg Hx   . Hypertension Neg Hx   . Hyperlipidemia Neg Hx   . Heart attack Neg Hx   . Diabetes Neg Hx   . Thyroid disease Mother    PE: BP 110/62 mmHg  Pulse 73  Temp(Src) 97.4 F (36.3 C) (Oral)  Resp 12  Ht  (1.651 m)  Wt 132 lb (59.875 kg)  BMI 21.97 kg/m2  SpO2 94% Wt Readings from Last 3 Encounters:  02/04/15 132 lb (59.875 kg)  12/08/14 133 lb (60.328 kg)  07/31/13 135 lb (61.236 kg)   Constitutional: normal weight, in NAD Eyes: PERRLA, EOMI, no exophthalmos ENT: moist mucous membranes, slight, symmetric, non-nodular, thyromegaly, no cervical lymphadenopathy Cardiovascular: RRR, No MRG Respiratory: CTA  B Gastrointestinal: abdomen soft, NT, ND, BS+ Musculoskeletal: no deformities, strength intact in all 4 Skin: moist, warm, no rashes Neurological: no tremor with outstretched hands, DTR normal in all 4  ASSESSMENT: 1. Hypothyroidism 2/2 Hashimoto's thyroiditis  PLAN:  1. Patient with long-standing hypothyroidism, on levothyroxine therapy. She appears euthyroid and mentions she feels better after she switched from desiccated thyroid extract to Levothyroxine. I think this maybe as she was started on a higher LT4 dose than calculated from the  NatureThroid dose (50 >> 75 mcg daily).  - will continue LT4 75 mcg daily - We discussed about correct intake of levothyroxine, fasting, with water, separated by at least 30 minutes from breakfast, and separated by more than 4 hours from calcium, iron, multivitamins, acid reflux medications (PPIs). I advised her to separate it from b'fast by > 30 min. - will check thyroid tests today: TSH, free T4, and ATA and TPO Abs - If these are abnormal, she will need to return in 6-8 weeks for repeat labs - If these are normal, I will see her back in 6 months  - time spent with the patient: 40 minutes, of which >50% was spent in obtaining information about her Hashimoto's thyroiditis history, reviewing her previous labs, evaluations, and treatments, counseling her about her condition (please see the discussed topics above), and developing a plan to further investigate and treat it; she had a number of questions which I addressed.  Component     Latest Ref Rng 02/04/2015  TSH     0.35 - 4.50 uIU/mL 0.15 (L)  Free T4     0.60 - 1.60 ng/dL 4.09  Thyroglobulin Antibody     0.0 - 0.9 IU/mL 2.4 (H)  Thyroperoxidase Ab SerPl-aCnc     0 - 34 IU/mL 18   Confirmed Hashimoto's thyroiditis, based on elevated ATA.  TSH suppressed, indicating that the levothyroxine dose is too high. I will ask her to start alternating 50 with 75 g daily of levothyroxine and will have her back for labs in 5-6 weeks.

## 2015-02-05 LAB — THYROID PEROXIDASE ANTIBODY: Thyroperoxidase Ab SerPl-aCnc: 18 IU/mL (ref 0–34)

## 2015-02-05 LAB — THYROGLOBULIN ANTIBODY: Thyroglobulin Antibody: 2.4 IU/mL — ABNORMAL HIGH (ref 0.0–0.9)

## 2015-02-05 MED ORDER — LEVOTHYROXINE SODIUM 50 MCG PO TABS: 50.0000 ug | ORAL_TABLET | ORAL | Status: DC

## 2015-02-05 MED ORDER — LEVOTHYROXINE SODIUM 75 MCG PO TABS: 75.0000 ug | ORAL_TABLET | ORAL | Status: DC

## 2015-02-10 ENCOUNTER — Telehealth: Payer: Self-pay | Admitting: Internal Medicine

## 2015-02-10 NOTE — Telephone Encounter (Signed)
Please release all labs results in MyChart. Thank you.

## 2015-02-10 NOTE — Telephone Encounter (Signed)
She probably did not have an active mychart when I got the results. Please release her labs.

## 2015-02-10 NOTE — Telephone Encounter (Signed)
Patients mother called stating that her daughter cannot view her results on mychart  Please advise   Thank you

## 2015-02-11 NOTE — Telephone Encounter (Signed)
Called pt's mother and advised her to have pt look in MyChart. I believe all labs have been released for her to read. Please let us know if not.

## 2015-03-24 ENCOUNTER — Other Ambulatory Visit (INDEPENDENT_AMBULATORY_CARE_PROVIDER_SITE_OTHER): Payer: 59

## 2015-03-24 DIAGNOSIS — E063 Autoimmune thyroiditis: Secondary | ICD-10-CM

## 2015-03-24 DIAGNOSIS — E038 Other specified hypothyroidism: Secondary | ICD-10-CM

## 2015-03-24 LAB — T4, FREE: Free T4: 1.04 ng/dL (ref 0.60–1.60)

## 2015-03-24 LAB — TSH: TSH: 0.64 u[IU]/mL (ref 0.35–4.50)

## 2015-04-18 IMAGING — CR DG LUMBAR SPINE COMPLETE 4+V
5 series · 5 of 5 positions shown · non-contrast
Comparison: None.

CLINICAL DATA: Low back pain for 1 month, the patient is a runner,
no acute injury

EXAM:
LUMBAR SPINE - COMPLETE 4+ VIEW

[t l-spine a.p.]
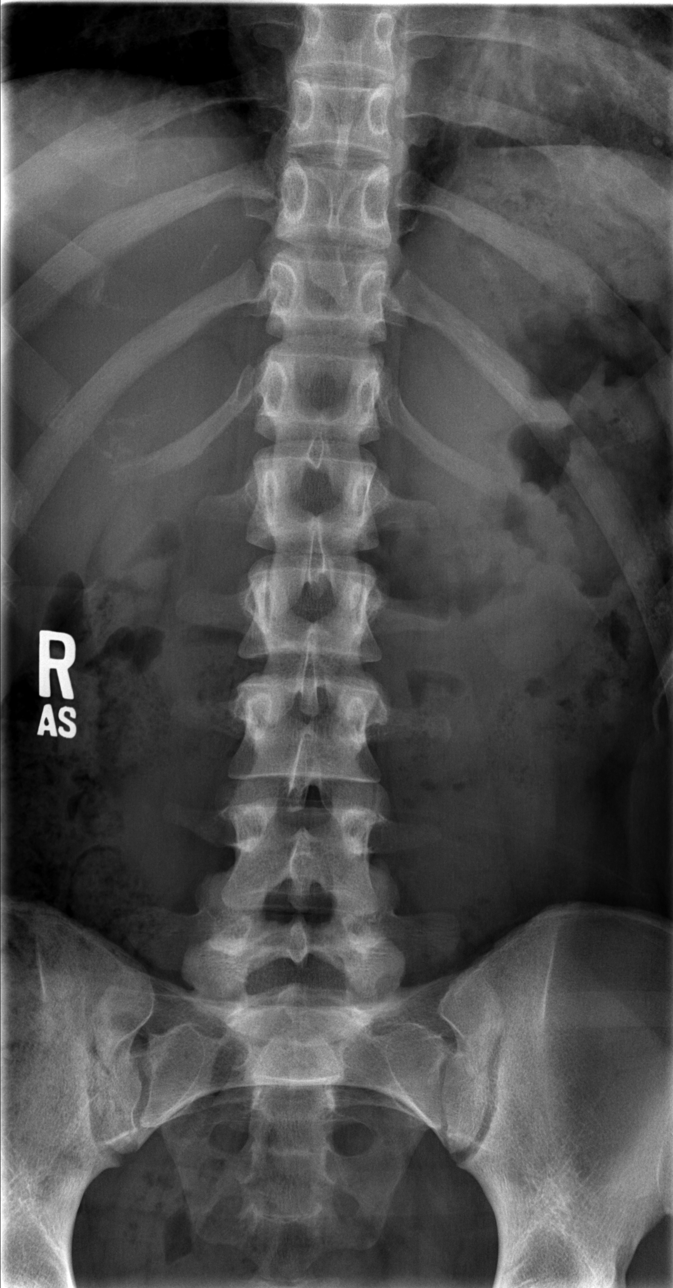

[t l-spine oblique exposure (1 of 2)]
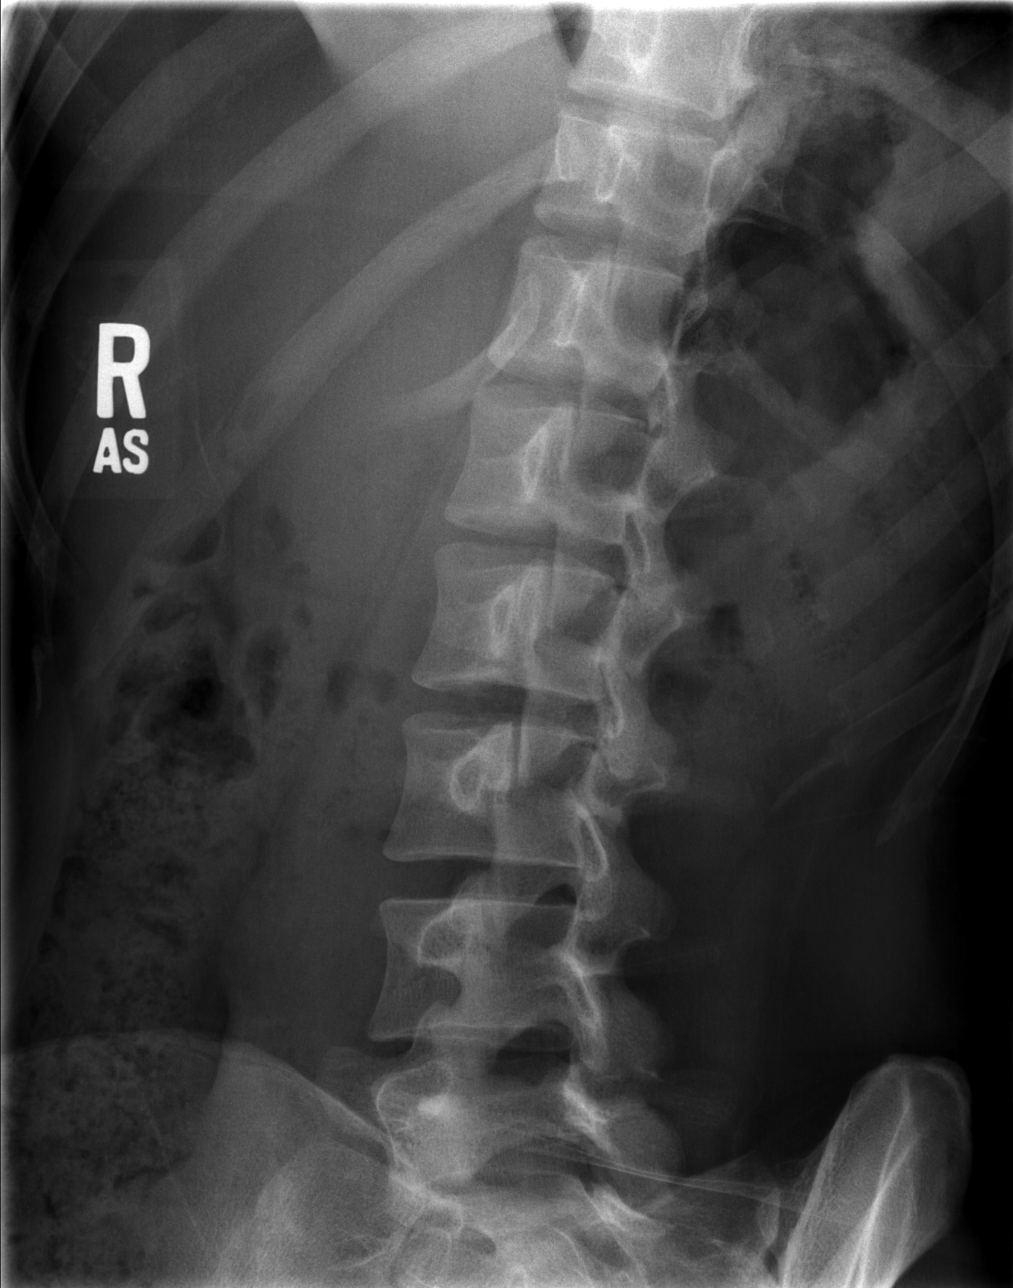

[t l-spine oblique exposure (2 of 2)]
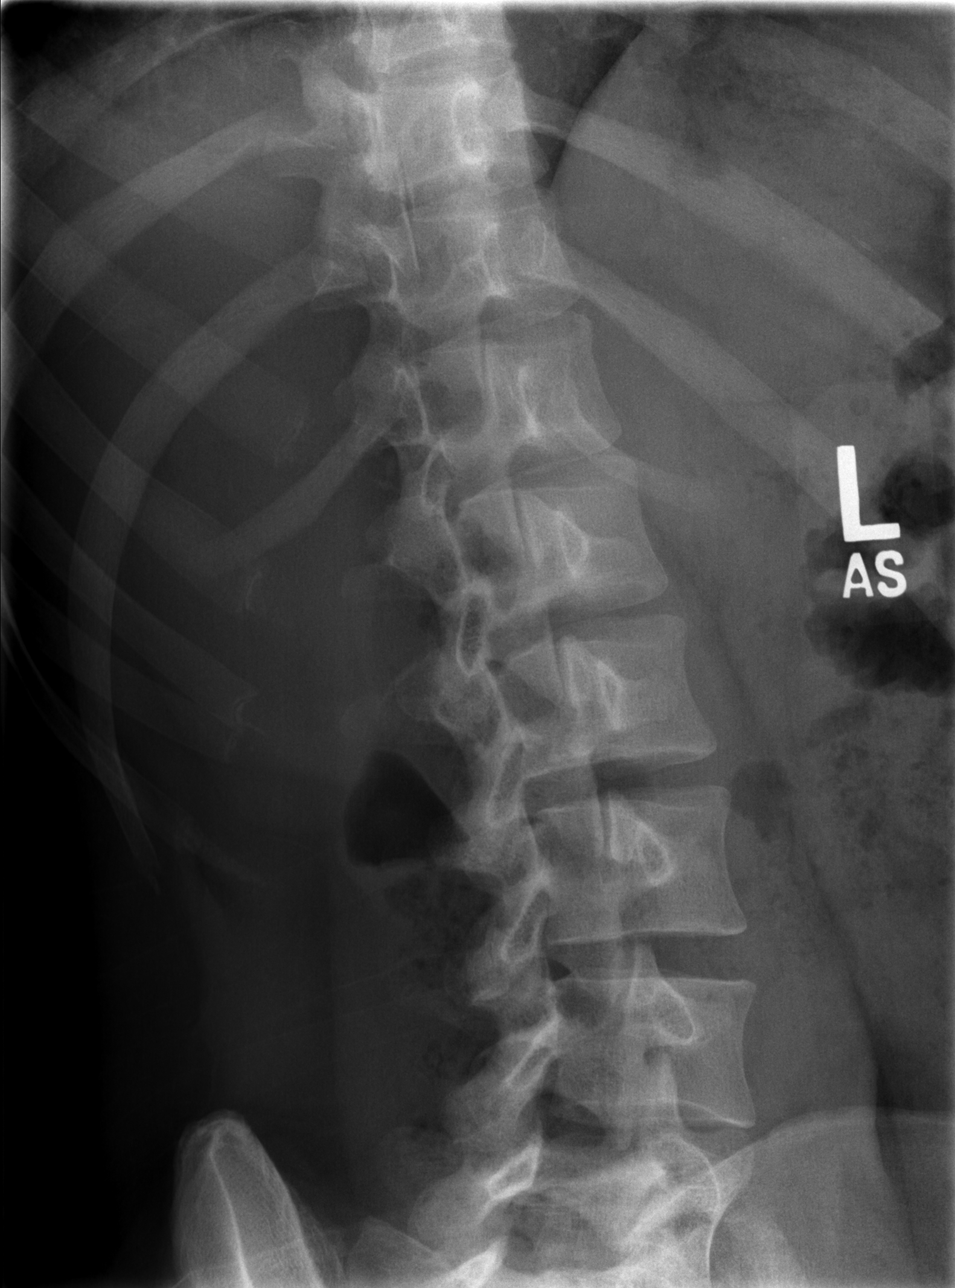

[t l-spine lat]
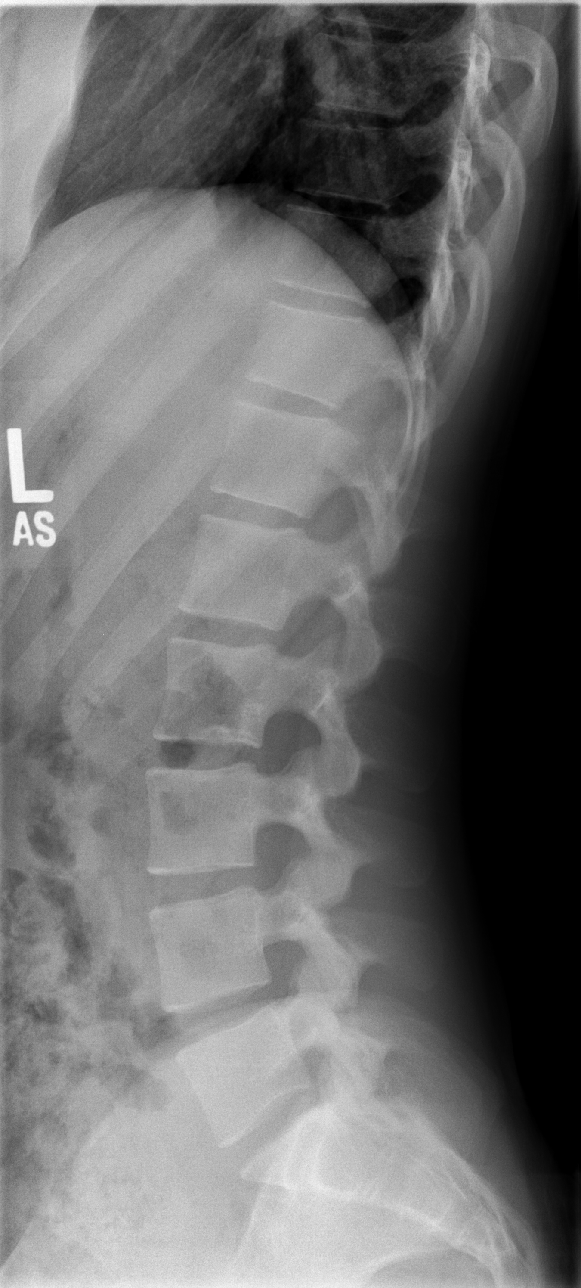

[t l-spine l5-s1 spot]
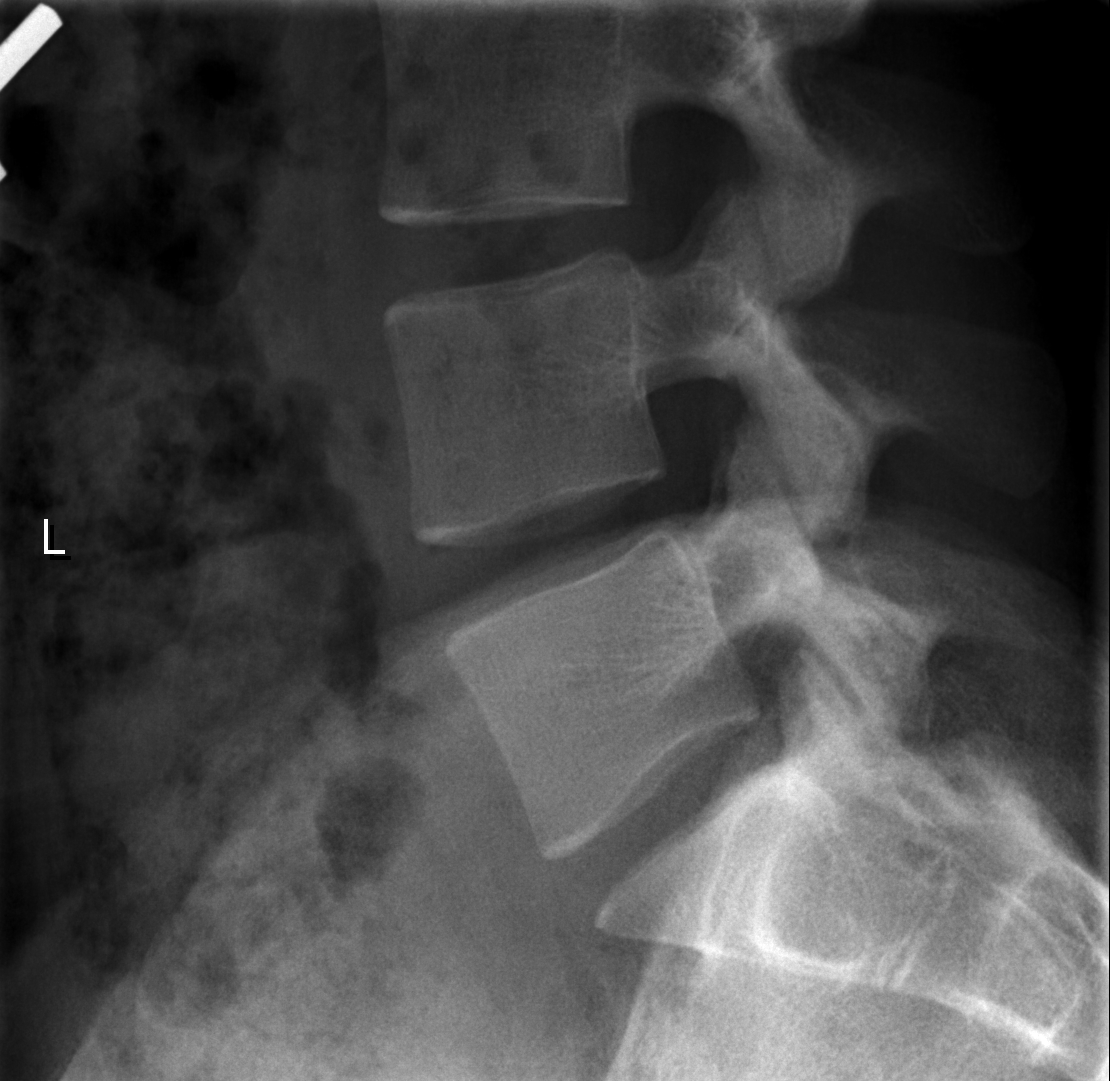

[5 of 5 positions shown; findings below may reference images not displayed]

FINDINGS: The lumbar vertebrae are in normal alignment. Intervertebral disc
spaces appear normal. No pars defect is seen. The SI joints appear
corticated.
IMPRESSION: Normal alignment.  Normal intervertebral disc space.

## 2015-04-30 ENCOUNTER — Other Ambulatory Visit: Payer: Self-pay | Admitting: *Deleted

## 2015-04-30 ENCOUNTER — Other Ambulatory Visit (INDEPENDENT_AMBULATORY_CARE_PROVIDER_SITE_OTHER): Payer: 59

## 2015-04-30 DIAGNOSIS — E039 Hypothyroidism, unspecified: Secondary | ICD-10-CM

## 2015-04-30 LAB — T4, FREE: Free T4: 0.99 ng/dL (ref 0.60–1.60)

## 2015-04-30 LAB — TSH: TSH: 0.4 u[IU]/mL (ref 0.35–4.50)

## 2015-06-13 ENCOUNTER — Other Ambulatory Visit: Payer: Self-pay | Admitting: Internal Medicine

## 2015-09-15 ENCOUNTER — Encounter: Payer: Self-pay | Admitting: Internal Medicine

## 2015-09-15 ENCOUNTER — Ambulatory Visit (INDEPENDENT_AMBULATORY_CARE_PROVIDER_SITE_OTHER): Payer: 59 | Admitting: Internal Medicine

## 2015-09-15 VITALS — BP 118/64 | HR 56 | Temp 97.4°F | Resp 12 | Wt 125.4 lb

## 2015-09-15 DIAGNOSIS — E063 Autoimmune thyroiditis: Secondary | ICD-10-CM | POA: Insufficient documentation

## 2015-09-15 DIAGNOSIS — E038 Other specified hypothyroidism: Secondary | ICD-10-CM | POA: Insufficient documentation

## 2015-09-15 LAB — TSH: TSH: 0.97 u[IU]/mL (ref 0.35–4.50)

## 2015-09-15 LAB — T4, FREE: Free T4: 1.07 ng/dL (ref 0.60–1.60)

## 2015-09-15 NOTE — Progress Notes (Signed)
Patient ID: Natalie Powell, female   DOB: November 20, 1990, 25 y.o.   MRN: [id number]   HPI  Natalie Powell is a 25 y.o.-year-old female, returning for f/u for Hashimoto's hypothyroidism. Last OV 6.5 mo ago.  Reviewed and addended hx: Pt. has been dx with hypothyroidism in 2015 (at that time, she had positive TPO Ab's); is on Levothyroxine 75 mcg. She was on NatureThroid 32.5 mg, changed to LT4 by Dr Everardo All in 11/2014. She feels better on the LT4. She feels she got her eyebrows back.  She is taking the LT4: 75 alternating with 50 mcg every other day: - fasting - with water - separated by 20 min from b'fast  - no calcium, iron, PPIs, multivitamins   I reviewed pt's thyroid tests: Lab Results  Component Value Date   TSH 0.40 04/30/2015   TSH 0.64 03/24/2015   TSH 0.15* 02/04/2015   FREET4 0.99 04/30/2015   FREET4 1.04 03/24/2015   FREET4 1.22 02/04/2015   Component     Latest Ref Rng 02/04/2015  Thyroglobulin Antibody     0.0 - 0.9 IU/mL 2.4 (H)  Thyroperoxidase Ab SerPl-aCnc     0 - 34 IU/mL 18   05/2014: normal TSH 09/22/2013: TSH 0.85, fT4 0.87 (0.61-1.12)  Pt denies:  - fatigue - cold intolerance - depression - constipation - dry skin - hair loss  She has: - + weight loss - 7 lbs (unintentional) - + irregular menses - cycles continue to come every second month; 1 ovarian cyst  Pt denies feeling nodules in neck, hoarseness, dysphagia/odynophagia, SOB with lying down.  She has + FH of thyroid disorders in: mother  - hypothyroidisn. No FH of thyroid cancer.  No h/o radiation tx to head or neck. No recent use of iodine supplements.  ROS: Constitutional: + see HPI Eyes: no blurry vision, no xerophthalmia ENT: no sore throat, no nodules palpated in throat, no dysphagia/odynophagia, no hoarseness Cardiovascular: no CP/SOB/palpitations/leg swelling Respiratory: no cough/SOB Gastrointestinal: no N/V/D/C Musculoskeletal: no muscle/joint aches Skin: no  rashes Neurological: no tremors/numbness/tingling/dizziness  I reviewed pt's medications, allergies, PMH, social hx, family hx, and changes were documented in the history of present illness. Otherwise, unchanged from my initial visit note.  Past Medical History  Diagnosis Date  . ALLERGIC RHINITIS   . Asthma     Exercised Induced   No past surgical history on file. Social History   Social History  . Marital Status: Single    Spouse Name: N/A  . Number of Children: 0   Occupational History  . Student    Social History Main Topics  . Smoking status: Never Smoker   . Smokeless tobacco: No  . Alcohol Use: 0.0 oz/week    0 Standard drinks or equivalent per week  . Drug Use: No   Social History Narrative   Lives with parents-Mother is an Occupational hygienist for the [employer]   Current Outpatient Prescriptions on File Prior to Visit  Medication Sig Dispense Refill  . levothyroxine (SYNTHROID, LEVOTHROID) 50 MCG tablet TAKE 1 TABLET(50 MCG) BY MOUTH EVERY OTHER DAY 30 tablet 2  . levothyroxine (SYNTHROID, LEVOTHROID) 75 MCG tablet Take 1 tablet (75 mcg total) by mouth every other day. 30 tablet 11   No current facility-administered medications on file prior to visit.   No Known Allergies Family History  Problem Relation Age of Onset  . Allergies Mother   . Asthma Mother   . Sudden death Neg Hx   . Hypertension Neg  Hx   . Hyperlipidemia Neg Hx   . Heart attack Neg Hx   . Diabetes Neg Hx   . Thyroid disease Mother    PE: BP 118/64 mmHg  Pulse 56  Temp(Src) 97.4 F (36.3 C) (Oral)  Resp 12  Wt 125 lb 6.4 oz (56.881 kg)  SpO2 99% Body mass index is 20.87 kg/(m^2). Wt Readings from Last 3 Encounters:  09/15/15 125 lb 6.4 oz (56.881 kg)  02/04/15 132 lb (59.875 kg)  12/08/14 133 lb (60.328 kg)   Constitutional: normal weight, in NAD Eyes: PERRLA, EOMI, no exophthalmos ENT: moist mucous membranes,no thyromegaly, no cervical lymphadenopathy Cardiovascular: RRR, No  MRG Respiratory: CTA B Gastrointestinal: abdomen soft, NT, ND, BS+ Musculoskeletal: no deformities, strength intact in all 4 Skin: moist, warm, no rashes Neurological: very slight tremor with outstretched hands, DTR normal in all 4  ASSESSMENT: 1. Hypothyroidism 2/2 Hashimoto's thyroiditis  PLAN:  1. Patient with long-standing hypothyroidism, on levothyroxine therapy. She appears euthyroid and again mentions she feels better after she switched from desiccated thyroid extract to Levothyroxine. However, she had a 7 lb unintentional weight loss since last visit and she is bothered by it. She increased her food intake but did not gain weight...  - we reviewed her previous TSH levels >> TSH close to the LLN but still in the normal range >> we will continue LT4 50 alternating with 75 mcg qod, but will have a low threshold to decrease the dose to 50 mcg daily if TSH <1 2/2 her weight loss. She agrees with this. - We discussed about correct intake of levothyroxine, fasting, with water, separated by at least 30 minutes from breakfast, and separated by more than 4 hours from calcium, iron, multivitamins, acid reflux medications (PPIs). She is taking it correctly. - will check thyroid tests today: TSH, free T4 today - If these are abnormal, she will need to return in 6-8 weeks for repeat labs - If these are normal, will repeat them in 6 months and see her back in 1 year  Office Visit on 09/15/2015  Component Date Value Ref Range Status  . Free T4 09/15/2015 1.07  0.60 - 1.60 ng/dL Final  . TSH 16/10/960405/17/2017 0.97  0.35 - 4.50 uIU/mL Final   TFTs perfect. Will continue same dose of LT4.

## 2015-09-15 NOTE — Patient Instructions (Signed)
Please stop at the lab.  Please continue LT4 50 mcg alternating with 75 mcg every other day.  Take the thyroid hormone every day, with water, at least 30 minutes before breakfast, separated by at least 4 hours from: - acid reflux medications - calcium - iron - multivitamins  Please come back for a lab appointment in 6 months and 1 year for an appt.

## 2015-10-18 DIAGNOSIS — R634 Abnormal weight loss: Secondary | ICD-10-CM | POA: Diagnosis not present

## 2015-10-18 DIAGNOSIS — E063 Autoimmune thyroiditis: Secondary | ICD-10-CM | POA: Diagnosis not present

## 2015-11-12 DIAGNOSIS — K900 Celiac disease: Secondary | ICD-10-CM | POA: Diagnosis not present

## 2015-11-12 DIAGNOSIS — R140 Abdominal distension (gaseous): Secondary | ICD-10-CM | POA: Diagnosis not present

## 2015-11-12 DIAGNOSIS — R109 Unspecified abdominal pain: Secondary | ICD-10-CM | POA: Diagnosis not present

## 2015-11-19 DIAGNOSIS — L700 Acne vulgaris: Secondary | ICD-10-CM | POA: Diagnosis not present

## 2016-01-11 ENCOUNTER — Other Ambulatory Visit: Payer: Self-pay | Admitting: Internal Medicine

## 2016-01-11 ENCOUNTER — Other Ambulatory Visit: Payer: Self-pay | Admitting: Endocrinology

## 2016-02-16 ENCOUNTER — Other Ambulatory Visit: Payer: Self-pay | Admitting: Internal Medicine

## 2016-02-21 DIAGNOSIS — M65872 Other synovitis and tenosynovitis, left ankle and foot: Secondary | ICD-10-CM | POA: Diagnosis not present

## 2016-02-24 ENCOUNTER — Telehealth: Payer: Self-pay | Admitting: Internal Medicine

## 2016-02-24 ENCOUNTER — Other Ambulatory Visit: Payer: Self-pay

## 2016-02-24 MED ORDER — LEVOTHYROXINE SODIUM 75 MCG PO TABS: 75.0000 ug | ORAL_TABLET | ORAL | 2 refills | Status: DC

## 2016-02-24 NOTE — Telephone Encounter (Signed)
Pt called and requested the Levothyroxine 75MCG be sent into the pharmacy. [facility] on [address] in [place]

## 2016-03-09 ENCOUNTER — Other Ambulatory Visit: Payer: Self-pay | Admitting: Internal Medicine

## 2016-03-09 DIAGNOSIS — E038 Other specified hypothyroidism: Secondary | ICD-10-CM

## 2016-03-09 DIAGNOSIS — E063 Autoimmune thyroiditis: Principal | ICD-10-CM

## 2016-03-16 ENCOUNTER — Telehealth: Payer: Self-pay | Admitting: Internal Medicine

## 2016-03-16 MED ORDER — LEVOTHYROXINE SODIUM 75 MCG PO TABS: 75.0000 ug | ORAL_TABLET | ORAL | 2 refills | Status: DC

## 2016-03-16 MED ORDER — LEVOTHYROXINE SODIUM 50 MCG PO TABS: ORAL_TABLET | ORAL | 2 refills | Status: DC

## 2016-03-16 NOTE — Telephone Encounter (Signed)
Refills submitted.  

## 2016-03-16 NOTE — Telephone Encounter (Signed)
Pt needs her Levothyroxine 50 and 75 MCG sent to [facility] in [place] on [address]

## 2016-03-17 ENCOUNTER — Other Ambulatory Visit: Payer: 59

## 2016-03-22 ENCOUNTER — Other Ambulatory Visit: Payer: Self-pay | Admitting: Internal Medicine

## 2016-03-22 DIAGNOSIS — E038 Other specified hypothyroidism: Secondary | ICD-10-CM | POA: Diagnosis not present

## 2016-03-22 DIAGNOSIS — E063 Autoimmune thyroiditis: Secondary | ICD-10-CM | POA: Diagnosis not present

## 2016-03-23 LAB — TSH: TSH: 0.881 u[IU]/mL (ref 0.450–4.500)

## 2016-03-23 LAB — T4, FREE: FREE T4: 1.64 ng/dL (ref 0.82–1.77)

## 2016-04-12 DIAGNOSIS — Z01419 Encounter for gynecological examination (general) (routine) without abnormal findings: Secondary | ICD-10-CM | POA: Diagnosis not present

## 2016-04-12 DIAGNOSIS — Z6821 Body mass index (BMI) 21.0-21.9, adult: Secondary | ICD-10-CM | POA: Diagnosis not present

## 2016-04-12 DIAGNOSIS — Z309 Encounter for contraceptive management, unspecified: Secondary | ICD-10-CM | POA: Diagnosis not present

## 2016-04-12 DIAGNOSIS — Z113 Encounter for screening for infections with a predominantly sexual mode of transmission: Secondary | ICD-10-CM | POA: Diagnosis not present

## 2016-05-09 DIAGNOSIS — H5202 Hypermetropia, left eye: Secondary | ICD-10-CM | POA: Diagnosis not present

## 2016-05-09 DIAGNOSIS — H52221 Regular astigmatism, right eye: Secondary | ICD-10-CM | POA: Diagnosis not present

## 2016-08-22 DIAGNOSIS — M25522 Pain in left elbow: Secondary | ICD-10-CM | POA: Diagnosis not present

## 2016-09-14 ENCOUNTER — Ambulatory Visit: Payer: 59 | Admitting: Internal Medicine

## 2016-09-14 DIAGNOSIS — Z0289 Encounter for other administrative examinations: Secondary | ICD-10-CM

## 2016-09-16 ENCOUNTER — Other Ambulatory Visit: Payer: Self-pay | Admitting: Internal Medicine

## 2016-10-27 ENCOUNTER — Telehealth: Payer: Self-pay | Admitting: Internal Medicine

## 2016-10-27 ENCOUNTER — Other Ambulatory Visit: Payer: Self-pay | Admitting: Internal Medicine

## 2016-10-27 ENCOUNTER — Other Ambulatory Visit: Payer: Self-pay

## 2016-10-27 NOTE — Telephone Encounter (Signed)
Patient has not been seen here since 09/15/15 she has had 1 no-show and 2 cancellations should I refill or refuse? Please advise in Dr. Althea CharonGherghes absence

## 2016-10-27 NOTE — Telephone Encounter (Signed)
Please refill x 1 Ov is due  

## 2016-10-27 NOTE — Telephone Encounter (Signed)
**  Remind patient they can make refill requests via MyChart**  Medication refill request (Name & Dosage):  levothyroxine (SYNTHROID, LEVOTHROID) 75 MCG tablet  Preferred pharmacy (Name & Address):  [facility] [id number] - [place], [place] - [id number] [facility] [address] [phone] ([phone])      Other comments (if applicable):   Call patient to advise on if this can be refilled or if an appointment is needed.

## 2016-10-30 MED ORDER — LEVOTHYROXINE SODIUM 50 MCG PO TABS: ORAL_TABLET | ORAL | 1 refills | Status: DC

## 2016-10-30 NOTE — Telephone Encounter (Signed)
Refill submitted times 1.

## 2017-01-02 ENCOUNTER — Other Ambulatory Visit: Payer: Self-pay | Admitting: Internal Medicine

## 2017-01-02 ENCOUNTER — Other Ambulatory Visit: Payer: Self-pay | Admitting: Endocrinology

## 2017-01-04 ENCOUNTER — Telehealth: Payer: Self-pay | Admitting: Internal Medicine

## 2017-01-04 NOTE — Telephone Encounter (Signed)
Patient needs to know why her   levothyroxine (SYNTHROID, LEVOTHROID) 50 MCG tablet    levothyroxine (SYNTHROID, LEVOTHROID) 75 MCG tablet    Was denied. Call patient to advise. Patient states that she may want to go to [facility] to get labs done. Call patient to discuss further.

## 2017-01-08 NOTE — Telephone Encounter (Signed)
Please advise, I believe this was denied because of appointments. I wanted to check with you.  Thanks!

## 2017-01-08 NOTE — Telephone Encounter (Signed)
She missed or cancelled appts. Is she schedules a new one (AND does not miss it) >> we can refill.

## 2017-01-09 ENCOUNTER — Telehealth: Payer: Self-pay

## 2017-01-09 NOTE — Telephone Encounter (Signed)
Called patient and advised that she needed to make an appointment and to keep the appointment to continue to get refills. Gave call back number to make appointment, and once she does that we could give her just enough pills until the appointment.   

## 2017-01-09 NOTE — Telephone Encounter (Signed)
Called patient and advised that she needed to make an appointment and to keep the appointment to continue to get refills. Gave call back number to make appointment, and once she does that we could give her just enough pills until the appointment.

## 2017-01-10 DIAGNOSIS — E039 Hypothyroidism, unspecified: Secondary | ICD-10-CM | POA: Diagnosis not present

## 2017-01-19 DIAGNOSIS — E039 Hypothyroidism, unspecified: Secondary | ICD-10-CM | POA: Diagnosis not present

## 2017-03-01 DIAGNOSIS — F4323 Adjustment disorder with mixed anxiety and depressed mood: Secondary | ICD-10-CM | POA: Diagnosis not present

## 2017-05-10 DIAGNOSIS — H5202 Hypermetropia, left eye: Secondary | ICD-10-CM | POA: Diagnosis not present

## 2017-05-10 DIAGNOSIS — H52223 Regular astigmatism, bilateral: Secondary | ICD-10-CM | POA: Diagnosis not present

## 2017-06-01 DIAGNOSIS — Z6823 Body mass index (BMI) 23.0-23.9, adult: Secondary | ICD-10-CM | POA: Diagnosis not present

## 2017-06-01 DIAGNOSIS — Z113 Encounter for screening for infections with a predominantly sexual mode of transmission: Secondary | ICD-10-CM | POA: Diagnosis not present

## 2017-06-01 DIAGNOSIS — Z3009 Encounter for other general counseling and advice on contraception: Secondary | ICD-10-CM | POA: Diagnosis not present

## 2017-06-01 DIAGNOSIS — Z01419 Encounter for gynecological examination (general) (routine) without abnormal findings: Secondary | ICD-10-CM | POA: Diagnosis not present

## 2017-11-21 DIAGNOSIS — J45990 Exercise induced bronchospasm: Secondary | ICD-10-CM | POA: Diagnosis not present

## 2017-11-21 DIAGNOSIS — R0602 Shortness of breath: Secondary | ICD-10-CM | POA: Diagnosis not present

## 2017-11-23 DIAGNOSIS — E040 Nontoxic diffuse goiter: Secondary | ICD-10-CM | POA: Diagnosis not present

## 2017-11-23 DIAGNOSIS — E039 Hypothyroidism, unspecified: Secondary | ICD-10-CM | POA: Diagnosis not present

## 2018-05-24 DIAGNOSIS — H5202 Hypermetropia, left eye: Secondary | ICD-10-CM | POA: Diagnosis not present

## 2018-05-24 DIAGNOSIS — H52223 Regular astigmatism, bilateral: Secondary | ICD-10-CM | POA: Diagnosis not present

## 2018-05-24 DIAGNOSIS — H04123 Dry eye syndrome of bilateral lacrimal glands: Secondary | ICD-10-CM | POA: Diagnosis not present

## 2018-05-24 DIAGNOSIS — H5211 Myopia, right eye: Secondary | ICD-10-CM | POA: Diagnosis not present

## 2018-07-22 DIAGNOSIS — M84364A Stress fracture, left fibula, initial encounter for fracture: Secondary | ICD-10-CM | POA: Diagnosis not present

## 2018-07-31 DIAGNOSIS — M5416 Radiculopathy, lumbar region: Secondary | ICD-10-CM | POA: Diagnosis not present

## 2018-10-25 DIAGNOSIS — Z113 Encounter for screening for infections with a predominantly sexual mode of transmission: Secondary | ICD-10-CM | POA: Diagnosis not present

## 2018-10-25 DIAGNOSIS — Z6825 Body mass index (BMI) 25.0-25.9, adult: Secondary | ICD-10-CM | POA: Diagnosis not present

## 2018-10-25 DIAGNOSIS — Z3009 Encounter for other general counseling and advice on contraception: Secondary | ICD-10-CM | POA: Diagnosis not present

## 2018-10-25 DIAGNOSIS — Z01419 Encounter for gynecological examination (general) (routine) without abnormal findings: Secondary | ICD-10-CM | POA: Diagnosis not present

## 2018-10-29 DIAGNOSIS — E039 Hypothyroidism, unspecified: Secondary | ICD-10-CM | POA: Diagnosis not present

## 2018-12-30 DIAGNOSIS — Z01419 Encounter for gynecological examination (general) (routine) without abnormal findings: Secondary | ICD-10-CM | POA: Diagnosis not present

## 2018-12-30 DIAGNOSIS — Z23 Encounter for immunization: Secondary | ICD-10-CM | POA: Diagnosis not present

## 2018-12-30 DIAGNOSIS — Z3202 Encounter for pregnancy test, result negative: Secondary | ICD-10-CM | POA: Diagnosis not present

## 2018-12-30 DIAGNOSIS — R87810 Cervical high risk human papillomavirus (HPV) DNA test positive: Secondary | ICD-10-CM | POA: Diagnosis not present

## 2018-12-30 DIAGNOSIS — N870 Mild cervical dysplasia: Secondary | ICD-10-CM | POA: Diagnosis not present

## 2018-12-30 DIAGNOSIS — R87610 Atypical squamous cells of undetermined significance on cytologic smear of cervix (ASC-US): Secondary | ICD-10-CM | POA: Diagnosis not present

## 2019-02-01 DIAGNOSIS — Z20828 Contact with and (suspected) exposure to other viral communicable diseases: Secondary | ICD-10-CM | POA: Diagnosis not present

## 2019-03-04 DIAGNOSIS — Z23 Encounter for immunization: Secondary | ICD-10-CM | POA: Diagnosis not present

## 2019-03-18 DIAGNOSIS — Z20828 Contact with and (suspected) exposure to other viral communicable diseases: Secondary | ICD-10-CM | POA: Diagnosis not present

## 2019-07-28 ENCOUNTER — Other Ambulatory Visit: Payer: 59
